# Patient Record
Sex: Female | Born: 1975 | Race: White | Hispanic: No | Marital: Single | Smoking: Never smoker
Health system: Southern US, Community
[De-identification: ages and names within clinical notes are randomized; demographics above are authoritative.]

## PROBLEM LIST (undated history)

## (undated) DIAGNOSIS — F319 Bipolar disorder, unspecified: Secondary | ICD-10-CM

## (undated) DIAGNOSIS — E119 Type 2 diabetes mellitus without complications: Secondary | ICD-10-CM

## (undated) HISTORY — PX: EYE SURGERY: SHX253

---

## 2012-01-28 LAB — ETHANOL
Ethanol %: 0.029 % (ref 0.000–0.080)
Ethanol: 29 mg/dL

## 2012-01-28 LAB — COMPREHENSIVE METABOLIC PANEL
Albumin: 3.8 g/dL (ref 3.4–5.0)
Alkaline Phosphatase: 63 U/L (ref 50–136)
Anion Gap: 13 (ref 7–16)
BUN: 10 mg/dL (ref 7–18)
Bilirubin,Total: 0.1 mg/dL — ABNORMAL LOW (ref 0.2–1.0)
Calcium, Total: 8.1 mg/dL — ABNORMAL LOW (ref 8.5–10.1)
Chloride: 110 mmol/L — ABNORMAL HIGH (ref 98–107)
Co2: 20 mmol/L — ABNORMAL LOW (ref 21–32)
Creatinine: 0.69 mg/dL (ref 0.60–1.30)
EGFR (African American): >60
EGFR (Non-African Amer.): >60
Glucose: 91 mg/dL (ref 65–99)
Osmolality: 284 (ref 275–301)
Potassium: 3.2 mmol/L — ABNORMAL LOW (ref 3.5–5.1)
SGOT(AST): 15 U/L (ref 15–37)
SGPT (ALT): 18 U/L
Sodium: 143 mmol/L (ref 136–145)
Total Protein: 7 g/dL (ref 6.4–8.2)

## 2012-01-28 LAB — CBC
HCT: 35.7 % (ref 35.0–47.0)
MCHC: 33.6 g/dL (ref 32.0–36.0)
MCV: 88 fL (ref 80–100)
Platelet: 139 10*3/uL — ABNORMAL LOW (ref 150–440)
RDW: 12.8 % (ref 11.5–14.5)

## 2012-01-28 LAB — TSH: Thyroid Stimulating Horm: 1 u[IU]/mL

## 2012-01-28 LAB — ACETAMINOPHEN LEVEL: Acetaminophen: <2 ug/mL

## 2012-01-28 LAB — SALICYLATE LEVEL: Salicylates, Serum: <1.7 mg/dL

## 2012-01-29 LAB — DRUG SCREEN, URINE
Amphetamines, Ur Screen: NEGATIVE (ref ?–1000)
Barbiturates, Ur Screen: NEGATIVE (ref ?–200)
Benzodiazepine, Ur Scrn: NEGATIVE (ref ?–200)
Cannabinoid 50 Ng, Ur ~~LOC~~: NEGATIVE (ref ?–50)
Cocaine Metabolite,Ur ~~LOC~~: NEGATIVE (ref ?–300)
MDMA (Ecstasy)Ur Screen: NEGATIVE (ref ?–500)
Opiate, Ur Screen: NEGATIVE (ref ?–300)
Phencyclidine (PCP) Ur S: NEGATIVE (ref ?–25)
Tricyclic, Ur Screen: NEGATIVE (ref ?–1000)

## 2012-01-29 LAB — URINALYSIS, COMPLETE
Glucose,UR: NEGATIVE mg/dL (ref 0–75)
Protein: NEGATIVE
WBC UR: 8 /HPF (ref 0–5)

## 2012-02-01 ENCOUNTER — Inpatient Hospital Stay: Payer: Self-pay | Admitting: Psychiatry

## 2012-02-04 LAB — BASIC METABOLIC PANEL
Anion Gap: 7 (ref 7–16)
BUN: 16 mg/dL (ref 7–18)
Chloride: 106 mmol/L (ref 98–107)
Co2: 26 mmol/L (ref 21–32)
Creatinine: 0.9 mg/dL (ref 0.60–1.30)
EGFR (Non-African Amer.): >60
Osmolality: 278 (ref 275–301)
Potassium: 4 mmol/L (ref 3.5–5.1)

## 2012-02-04 LAB — LITHIUM LEVEL: Lithium: 0.68 mmol/L

## 2012-02-04 LAB — LIPID PANEL: VLDL Cholesterol, Calc: 17 mg/dL (ref 5–40)

## 2012-02-07 LAB — BASIC METABOLIC PANEL
Anion Gap: 7 (ref 7–16)
BUN: 16 mg/dL (ref 7–18)
Calcium, Total: 8.6 mg/dL (ref 8.5–10.1)
Chloride: 102 mmol/L (ref 98–107)
Creatinine: 1 mg/dL (ref 0.60–1.30)
Osmolality: 274 (ref 275–301)
Potassium: 3.7 mmol/L (ref 3.5–5.1)
Sodium: 137 mmol/L (ref 136–145)

## 2012-02-11 LAB — LITHIUM LEVEL: Lithium: 0.62 mmol/L

## 2012-02-14 LAB — LITHIUM LEVEL: Lithium: 0.64 mmol/L

## 2012-02-23 ENCOUNTER — Inpatient Hospital Stay: Payer: Self-pay | Admitting: Psychiatry

## 2012-02-23 LAB — DRUG SCREEN, URINE
Barbiturates, Ur Screen: NEGATIVE (ref ?–200)
Benzodiazepine, Ur Scrn: NEGATIVE (ref ?–200)
Cannabinoid 50 Ng, Ur ~~LOC~~: NEGATIVE (ref ?–50)
MDMA (Ecstasy)Ur Screen: NEGATIVE (ref ?–500)
Opiate, Ur Screen: NEGATIVE (ref ?–300)
Tricyclic, Ur Screen: NEGATIVE (ref ?–1000)

## 2012-02-23 LAB — COMPREHENSIVE METABOLIC PANEL
Alkaline Phosphatase: 46 U/L — ABNORMAL LOW (ref 50–136)
Calcium, Total: 8.3 mg/dL — ABNORMAL LOW (ref 8.5–10.1)
Chloride: 110 mmol/L — ABNORMAL HIGH (ref 98–107)
Creatinine: 0.71 mg/dL (ref 0.60–1.30)
Osmolality: 281 (ref 275–301)
Potassium: 3.8 mmol/L (ref 3.5–5.1)
SGPT (ALT): 45 U/L
Sodium: 141 mmol/L (ref 136–145)

## 2012-02-23 LAB — CBC
HCT: 38.9 % (ref 35.0–47.0)
MCHC: 33.3 g/dL (ref 32.0–36.0)
RBC: 4.38 10*6/uL (ref 3.80–5.20)

## 2012-02-23 LAB — LITHIUM LEVEL: Lithium: 0.52 mmol/L — ABNORMAL LOW

## 2012-02-23 LAB — ACETAMINOPHEN LEVEL: Acetaminophen: <2 ug/mL

## 2012-02-23 LAB — ETHANOL: Ethanol: <3 mg/dL

## 2012-02-24 LAB — HEMOGLOBIN A1C: Hemoglobin A1C: 4.7 % (ref 4.2–6.3)

## 2012-02-29 LAB — COMPREHENSIVE METABOLIC PANEL
Albumin: 3.1 g/dL — ABNORMAL LOW (ref 3.4–5.0)
Anion Gap: 7 (ref 7–16)
BUN: 11 mg/dL (ref 7–18)
Bilirubin,Total: 0.3 mg/dL (ref 0.2–1.0)
Chloride: 109 mmol/L — ABNORMAL HIGH (ref 98–107)
Co2: 29 mmol/L (ref 21–32)
EGFR (African American): >60
EGFR (Non-African Amer.): >60
Osmolality: 287 (ref 275–301)
Potassium: 4.1 mmol/L (ref 3.5–5.1)
SGPT (ALT): 31 U/L
Total Protein: 6.1 g/dL — ABNORMAL LOW (ref 6.4–8.2)

## 2012-04-04 ENCOUNTER — Emergency Department (HOSPITAL_COMMUNITY)
Admission: EM | Admit: 2012-04-04 | Discharge: 2012-04-06 | Disposition: A | Discharge status: Home / Self Care | Payer: Self-pay | Attending: Emergency Medicine | Admitting: Emergency Medicine

## 2012-04-04 ENCOUNTER — Encounter (HOSPITAL_COMMUNITY): Payer: Self-pay

## 2012-04-04 DIAGNOSIS — Z79899 Other long term (current) drug therapy: Secondary | ICD-10-CM | POA: Insufficient documentation

## 2012-04-04 DIAGNOSIS — E119 Type 2 diabetes mellitus without complications: Secondary | ICD-10-CM | POA: Insufficient documentation

## 2012-04-04 DIAGNOSIS — F609 Personality disorder, unspecified: Secondary | ICD-10-CM | POA: Insufficient documentation

## 2012-04-04 DIAGNOSIS — F319 Bipolar disorder, unspecified: Secondary | ICD-10-CM | POA: Insufficient documentation

## 2012-04-04 HISTORY — DX: Bipolar disorder, unspecified: F31.9

## 2012-04-04 HISTORY — DX: Type 2 diabetes mellitus without complications: E11.9

## 2012-04-04 LAB — DIFFERENTIAL
Basophils Relative: 1 % (ref 0–1)
Eosinophils Absolute: 0.2 10*3/uL (ref 0.0–0.7)
Monocytes Absolute: 0.5 10*3/uL (ref 0.1–1.0)
Monocytes Relative: 9 % (ref 3–12)
Neutrophils Relative %: 53 % (ref 43–77)

## 2012-04-04 LAB — BASIC METABOLIC PANEL
BUN: 16 mg/dL (ref 6–23)
Calcium: 9.4 mg/dL (ref 8.4–10.5)
Creatinine, Ser: 0.73 mg/dL (ref 0.50–1.10)
GFR calc Af Amer: >90 mL/min (ref >90)
GFR calc non Af Amer: >90 mL/min (ref >90)

## 2012-04-04 LAB — CBC
HCT: 37.1 % (ref 36.0–46.0)
Hemoglobin: 12.6 g/dL (ref 12.0–15.0)
MCH: 30.1 pg (ref 26.0–34.0)
MCHC: 34 g/dL (ref 30.0–36.0)

## 2012-04-04 LAB — POCT PREGNANCY, URINE: Preg Test, Ur: NEGATIVE

## 2012-04-04 LAB — ETHANOL: Alcohol, Ethyl (B): <11 mg/dL (ref 0–11)

## 2012-04-04 MED ORDER — FLUOXETINE HCL 20 MG PO CAPS
ORAL_CAPSULE | ORAL | Status: AC
  Filled 2012-04-04: qty 2

## 2012-04-04 MED ORDER — LORAZEPAM 1 MG PO TABS: 1.0000 mg | ORAL_TABLET | Freq: Three times a day (TID) | ORAL | Status: DC | PRN

## 2012-04-04 MED ORDER — OLANZAPINE 5 MG PO TABS
ORAL_TABLET | ORAL | Status: AC
  Filled 2012-04-04: qty 3

## 2012-04-04 MED ORDER — FLUOXETINE HCL 20 MG PO CAPS
40.0000 mg | ORAL_CAPSULE | Freq: Every day | ORAL | Status: DC
  Filled 2012-04-04 (×2): qty 2

## 2012-04-04 MED ORDER — FLUOXETINE HCL 40 MG PO CAPS: 40.0000 mg | ORAL_CAPSULE | Freq: Every day | ORAL | Status: DC

## 2012-04-04 MED ORDER — LAMOTRIGINE 25 MG PO TABS
ORAL_TABLET | ORAL | Status: AC
  Filled 2012-04-04: qty 4

## 2012-04-04 MED ORDER — ONDANSETRON HCL 4 MG PO TABS: 4.0000 mg | ORAL_TABLET | Freq: Three times a day (TID) | ORAL | Status: DC | PRN

## 2012-04-04 MED ORDER — LAMOTRIGINE 100 MG PO TABS
100.0000 mg | ORAL_TABLET | Freq: Two times a day (BID) | ORAL | Status: DC
  Administered 2012-04-04 – 2012-04-06 (×4): 100 mg via ORAL
  Filled 2012-04-04 (×8): qty 1

## 2012-04-04 MED ORDER — BUPROPION HCL ER (XL) 150 MG PO TB24
150.0000 mg | ORAL_TABLET | Freq: Every day | ORAL | Status: DC
  Filled 2012-04-04 (×3): qty 1

## 2012-04-04 MED ORDER — OLANZAPINE 5 MG PO TABS
15.0000 mg | ORAL_TABLET | Freq: Every day | ORAL | Status: DC
  Filled 2012-04-04 (×2): qty 3
  Filled 2012-04-04 (×2): qty 2

## 2012-04-04 MED ORDER — NICOTINE 21 MG/24HR TD PT24: 21.0000 mg | MEDICATED_PATCH | Freq: Every day | TRANSDERMAL | Status: DC

## 2012-04-04 MED ORDER — ZOLPIDEM TARTRATE 5 MG PO TABS: 10.0000 mg | ORAL_TABLET | Freq: Every evening | ORAL | Status: DC | PRN

## 2012-04-04 MED ORDER — DIVALPROEX SODIUM 250 MG PO DR TAB
500.0000 mg | DELAYED_RELEASE_TABLET | Freq: Two times a day (BID) | ORAL | Status: DC
  Filled 2012-04-04: qty 2

## 2012-04-04 MED ORDER — BUPROPION HCL ER (XL) 150 MG PO TB24
ORAL_TABLET | ORAL | Status: AC
  Filled 2012-04-04: qty 1

## 2012-04-04 MED ORDER — ALUM & MAG HYDROXIDE-SIMETH 200-200-20 MG/5ML PO SUSP: 30.0000 mL | ORAL | Status: DC | PRN

## 2012-04-04 MED ORDER — TEMAZEPAM 15 MG PO CAPS
15.0000 mg | ORAL_CAPSULE | Freq: Every day | ORAL | Status: DC
  Administered 2012-04-05 (×2): 15 mg via ORAL
  Filled 2012-04-04 (×4): qty 1

## 2012-04-04 MED ORDER — FLUOXETINE HCL 20 MG PO CAPS
20.0000 mg | ORAL_CAPSULE | Freq: Every day | ORAL | Status: DC
  Filled 2012-04-04 (×4): qty 1

## 2012-04-04 MED ORDER — ACETAMINOPHEN 325 MG PO TABS: 650.0000 mg | ORAL_TABLET | ORAL | Status: DC | PRN

## 2012-04-04 MED ORDER — IBUPROFEN 400 MG PO TABS: 600.0000 mg | ORAL_TABLET | Freq: Three times a day (TID) | ORAL | Status: DC | PRN

## 2012-04-04 MED ORDER — PROPRANOLOL HCL 10 MG PO TABS
10.0000 mg | ORAL_TABLET | Freq: Two times a day (BID) | ORAL | Status: DC
  Filled 2012-04-04: qty 1

## 2012-04-04 NOTE — ED Notes (Signed)
Patient refusing Zyprexa, Depakote, and Inderal. However, acting pleasant. Mental Health made aware.
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
## 2012-04-04 NOTE — ED Notes (Signed)
Spoke with telepsych psychiatrist re: pt and present situation via telephone; pt now on call with psychiatrist via telepsych.

## 2012-04-04 NOTE — ED Notes (Signed)
Mental health in room to evaluate.
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
## 2012-04-04 NOTE — ED Notes (Signed)
Patient report given to this nurse. Assuming care of patient. Resting in bed on back. No distress. Equal chest rise and fall, regular, unlabored. Watching tv. Will continue to monitor. Sitter with patient.

## 2012-04-04 NOTE — ED Notes (Signed)
Remains resting sitting up in bed. No distress. Denies needs. Watching tv. Call bell at bedside.

## 2012-04-04 NOTE — ED Notes (Signed)
Resting sitting up in bed. No distress. Call bell within reach. Will continue to monitor. Sitter with patient.
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
## 2012-04-05 NOTE — ED Provider Notes (Signed)
History     CSN: [id number]  Arrival date & time 04/04/12  1456   First MD Initiated Contact with Patient 04/04/12 1513      Chief Complaint  Patient presents with  . V70.1    (Consider location/radiation/quality/duration/timing/severity/associated sxs/prior treatment) HPI....Marland Kitchenseen earlier today in ED and sent back to [facility] assisted living.   Nursing notes reviewed regarding her behavior at the assisted living.  Apparently she was not let back into the facility.  She returned to ED.  No suicidal or homicidal ideation. She states she does not like living at current facility.  Past Medical History  Diagnosis Date  . Diabetes mellitus     diet controlled  . Bipolar 1 disorder     Past Surgical History  Procedure Date  . Eye surgery     No family history on file.  History  Substance Use Topics  . Smoking status: Never Smoker   . Smokeless tobacco: Not on file  . Alcohol Use: No    OB History    Grav Para Term Preterm Abortions TAB SAB Ect Mult Living                  Review of Systems  All other systems reviewed and are negative.    Allergies  Review of patient's allergies indicates no known allergies.  Home Medications   Current Outpatient Rx  Name Route Sig Dispense Refill  . BUPROPION HCL ER (XL) 150 MG PO TB24 Oral Take 150 mg by mouth daily.    Marland Kitchen DIVALPROEX SODIUM 500 MG PO TBEC Oral Take 500 mg by mouth 2 (two) times daily.    Marland Kitchen FLUOXETINE HCL 40 MG PO CAPS Oral Take 40 mg by mouth daily.    Marland Kitchen LAMOTRIGINE 100 MG PO TABS Oral Take 100 mg by mouth 2 (two) times daily.    . ADULT MULTIVITAMIN W/MINERALS CH Oral Take 1 tablet by mouth daily.    Marland Kitchen OLANZAPINE 15 MG PO TABS Oral Take 15 mg by mouth at bedtime.    . QC HEMORRHOIDAL RE Rectal Place 1 application rectally 2 (two) times daily. As directed    . PROPRANOLOL HCL 10 MG PO TABS Oral Take 10 mg by mouth 2 (two) times daily.    Marland Kitchen TEMAZEPAM 15 MG PO CAPS Oral Take 15 mg by mouth at bedtime.       BP 111/81  Pulse 71  Temp 98.2 F (36.8 C) (Oral)  Resp 17  Ht 5\' 8"  (1.727 m)  Wt 148 lb (67.132 kg)  BMI 22.50 kg/m2  SpO2 99%  LMP 03/31/2012  Physical Exam  Nursing note and vitals reviewed. Constitutional: She is oriented to person, place, and time. She appears well-developed and well-nourished.  HENT:  Head: Normocephalic and atraumatic.  Eyes: Conjunctivae and EOM are normal. Pupils are equal, round, and reactive to light.  Neck: Normal range of motion. Neck supple.  Cardiovascular: Normal rate and regular rhythm.   Pulmonary/Chest: Effort normal and breath sounds normal.  Abdominal: Soft. Bowel sounds are normal.  Musculoskeletal: Normal range of motion.  Neurological: She is alert and oriented to person, place, and time.  Skin: Skin is warm and dry.  Psychiatric: She has a normal mood and affect.    ED Course  Procedures (including critical care time)  Labs Reviewed  CBC - Abnormal; Notable for the following:    Platelets 120 (*)     All other components within normal limits  VALPROIC ACID LEVEL -  Abnormal; Notable for the following:    Valproic Acid Lvl 27.6 (*)     All other components within normal limits  DIFFERENTIAL  BASIC METABOLIC PANEL  URINE RAPID DRUG SCREEN (HOSP PERFORMED)  ETHANOL  POCT PREGNANCY, URINE   No results found.   1. Bipolar 1 disorder   2. Personality disorder       MDM  RN has brought this situation to the attention of social work.  Will reevaluate in the morning and attempt to place her        Donnetta Hutching, MD 04/05/12 2135

## 2012-04-05 NOTE — ED Provider Notes (Signed)
Pt stable in the ED, no distress, labs reviewed, vitals reviewed. Stable for d/c back to [facility] BP 111/81  Pulse 71  Temp 98.2 F (36.8 C) (Oral)  Resp 17  Ht 5\' 8"  (1.727 m)  Wt 148 lb (67.132 kg)  BMI 22.50 kg/m2  SpO2 99%  LMP 03/31/2012   Joya Gaskins, MD 04/05/12 1401

## 2012-04-05 NOTE — Discharge Instructions (Signed)
Mood Disorders Mood disorders are conditions that affect the way a person feels emotionally. The main mood disorders include:  Depression.   Bipolar disorder.   Dysthymia. Dysthymia is a mild, lasting (chronic) depression. Symptoms of dysthymia are similar to depression, but not as severe.   Cyclothymia. Cyclothymia includes mood swings, but the highs and lows are not as severe as they are in bipolar disorder. Symptoms of cyclothymia are similar to those of bipolar disorder, but less extreme.  CAUSES  Mood disorders are probably caused by a combination of factors. People with mood disorders seem to have physical and chemical changes in their brains. Mood disorders run in families, so there may be genetic causes. Severe trauma or stressful life events may also increase the risk of mood disorders.  SYMPTOMS  Symptoms of mood disorders depend on the specific type of condition. Depression symptoms include:  Feeling sad, worthless, or hopeless.   Negative thoughts.   Inability to enjoy one's usual activities.   Low energy.   Sleeping too much or too little.   Appetite changes.   Crying.   Concentration problems.   Thoughts of harming oneself.  Bipolar disorder symptoms include:  Periods of depression (see above symptoms).   Mood swings, from sadness and depression, to abnormal elation and excitement.   Periods of mania:   Racing thoughts.   Fast speech.   Poor judgment, and careless, dangerous choices.   Decreased need for sleep.   Risky behavior.   Difficulty concentrating.   Irritability.   Increased energy.   Increased sex drive.  DIAGNOSIS  There are no blood tests or X-rays that can confirm a mood disorder. However, your caregiver may choose to run some tests to make sure that there is not another physical cause for your symptoms. A mood disorder is usually diagnosed after an in-depth interview with a caregiver. TREATMENT  Mood disorders can be treated  with one or more of the following:  Medicine. This may include antidepressants, mood-stabilizers, or anti-psychotics.   Psychotherapy (talk therapy).   Cognitive behavioral therapy. You are taught to recognize negative thoughts and behavior patterns, and replace them with healthy thoughts and behaviors.   Electroconvulsive therapy. For very severe cases of deep depression, a series of treatments in which an electrical current is applied to the brain.   Vagus nerve stimulation. A pulse of electricity is applied to a portion of the brain.   Transcranial magnetic stimulation. Powerful magnets are placed on the head that produce electrical currents.   Hospitalization. In severe situations, or when someone is having serious thoughts of harming him or herself, hospitalization may be necessary in order to keep the person safe. This is also done to quickly start and monitor treatment.  HOME CARE INSTRUCTIONS   Take your medicine exactly as directed.   Attend all of your therapy sessions.   Try to eat regular, healthy meals.   Exercise daily. Exercise may improve mood symptoms.   Get good sleep.   Do not drink alcohol or use pot or other drugs. These can worsen mood symptoms and cause anxiety and psychosis.   Tell your caregiver if you develop any side effects, such as feeling sick to your stomach (nauseous), dry mouth, dizziness, constipation, drowsiness, tremor, weight gain, or sexual symptoms. He or she may suggest things you can do to improve symptoms.   Learn ways to cope with the stress of having a chronic illness. This includes yoga, meditation, tai chi, or participating in a support   group.   Drink enough water to keep your urine clear or pale yellow. Eat a high-fiber diet. These habits may help you avoid constipation from your medicine.  SEEK IMMEDIATE MEDICAL CARE IF:  Your mood worsens.   You have thoughts of hurting yourself or others.   You cannot care for yourself.   You  develop the sensation of hearing or seeing something that is not actually present (auditory or visual hallucinations).   You develop abnormal thoughts.  Document Released: 08/08/2009 Document Revised: 09/30/2011 Document Reviewed: 08/08/2009 [employer] Patient Information 2012 [employer]. 
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
## 2012-04-05 NOTE — ED Notes (Signed)
Pt left with law enforcement back to facility

## 2012-04-05 NOTE — ED Notes (Signed)
Public relations account executive called back to state they would not accept pt back. Informed them that they could not refuse pt to come back. Pt has been cleared by psychiatry and EMD. [employer] called to take pt back to facility to assure she gets in safely.

## 2012-04-05 NOTE — ED Notes (Addendum)
Spoke with staff member at [facility], stated that Reggie not there and there was no transportation there and that I would have to call back to see

## 2012-04-05 NOTE — ED Notes (Signed)
Pt received d/c instructions......

## 2012-04-05 NOTE — ED Notes (Signed)
Unable to reach [name], Guardian [facility]) of pt. Left message on her answering machineCalled [facility] back and was told that they were tying to figure something else out for the pt. Explained to staff that we are unable to keep holding her here. They stated they would call back asap.

## 2012-04-05 NOTE — ED Notes (Signed)
Talked with [employer]. States has been on phone all morning trying to find placement for pt. Advised Mrs. Coralee North pt has been discharged. She requested a little more time for a few more phone calls and then may have to go back to [facility] if cant get placement at this time. States will call me back.

## 2012-04-05 NOTE — ED Notes (Signed)
Resting with eyes closed and lights off, laying on back. No distress. Equal chest rise and fall, regular, unlabored. Will continue to monitor. Sitter with patient.

## 2012-04-05 NOTE — Clinical Social Work Note (Signed)
CSW received call from [place], with [facility].  She requested CSW to look up [facility] number for rest home placement.  Pt not found on [facility].  CSW provided [name] with phone number to [facility] to find pt in system.    Derenda Fennel, [place] [phone]

## 2012-04-05 NOTE — ED Notes (Addendum)
Spoke with Reggie at [facility], stated that pt was not to come back there, in report was told that pt would be going back to facility, Reggie stated that he would call back

## 2012-04-05 NOTE — ED Notes (Signed)
Attempted to call phone numbers listed and only got a recording. Message left on supervisor's phone.

## 2012-04-05 NOTE — Progress Notes (Signed)
[id number] Patient is currently a resident of [facility]. Her guardian will be arriving int he ER later today and is arranging for the patient to be transferred to another facility. No behavioral issues overnight.
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
## 2012-04-05 NOTE — ED Notes (Signed)
Patient refused all scheduled meds except Lamictal and Restoril.  States she is trying to get back on the medications she was on when she lived at a center before the current one she is assigned to - felt the best at that time.  Refused Depakote etc-- even though encouraged to take to gradually decrease medication if she is planning to stop rather than stop without a gradual decrease.  Pleasant, soft spoken, cooperative.  Offered bedtime snack - declined.

## 2012-04-05 NOTE — ED Notes (Signed)
Resting comfortably in bed. No distress. Denies needs. Call bell within reach. Sitter with patient.

## 2012-04-06 NOTE — ED Notes (Signed)
Pt sitting up eating lunch on side of bed.

## 2012-04-06 NOTE — ED Notes (Signed)
Holly Kirby, adult home specialist. Is going to call [name] again. Pt awoke to given meds. Only wanted lamictal. Refused rest of meds. Pt now sitting up eating breakfast tray
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
## 2012-04-06 NOTE — ED Notes (Signed)
Spoke with Holly Kirby with act team and was told she will try to get in touch with someone from Adult protective services and will let staff know what she finds out.  Pt calm and cooperative at this time.

## 2012-04-06 NOTE — ED Notes (Signed)
Called chasity again and left message.

## 2012-04-06 NOTE — Progress Notes (Signed)
0700 Patient awaiting social services for assistance in placement.

## 2012-04-06 NOTE — Clinical Social Work Note (Signed)
CSW received call from ED requesting assistance in placing pt.  Pt has been a resident at [facility] for about a month.  Facility has issued 30 day notice due to pt's behaviors.  Facility refused to accept pt back last night even with law enforcement, resulting in pt returning to ED.  CSW spoke with Reggie at [facility] and explained that they are not able to refuse pt's return until end of 30 days and facility would need to come pick pt up.  Pt has 9 days remaining. CSW also spoke with [name], pt's guardian, and informed her that she needed to have another facility for pt in 9 days to transfer to.  She has several interested facilities and will follow up with them.  Pt aware of above.  Reggie is currently in ED to pick up pt to return to [facility].  ED physician and RN notified as well as Patsy Lager, ACT team, who was working on case.    Holly Kirby, [place] [phone]
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
